# Patient Record
Sex: Male | Born: 1975 | Hispanic: No | Marital: Married | Smoking: Never smoker
Health system: Southern US, Community
[De-identification: ages and names within clinical notes are randomized; demographics above are authoritative.]

## PROBLEM LIST (undated history)

## (undated) DIAGNOSIS — I10 Essential (primary) hypertension: Secondary | ICD-10-CM

## (undated) DIAGNOSIS — N201 Calculus of ureter: Secondary | ICD-10-CM

## (undated) DIAGNOSIS — M549 Dorsalgia, unspecified: Secondary | ICD-10-CM

## (undated) DIAGNOSIS — E119 Type 2 diabetes mellitus without complications: Secondary | ICD-10-CM

---

## 2011-11-27 ENCOUNTER — Other Ambulatory Visit: Payer: Self-pay | Admitting: Family Medicine

## 2011-11-27 ENCOUNTER — Telehealth: Payer: Self-pay

## 2011-11-27 MED ORDER — ATORVASTATIN CALCIUM 10 MG PO TABS: 10.0000 mg | ORAL_TABLET | Freq: Every day | ORAL | Status: DC

## 2011-11-27 MED ORDER — LOSARTAN POTASSIUM 25 MG PO TABS: 25.0000 mg | ORAL_TABLET | Freq: Every day | ORAL | Status: DC

## 2011-11-27 MED ORDER — METFORMIN HCL 500 MG PO TABS: 500.0000 mg | ORAL_TABLET | Freq: Two times a day (BID) | ORAL | Status: DC

## 2011-11-27 NOTE — Telephone Encounter (Signed)
Meds refilled x 1 month, needs office visit for more.

## 2011-11-27 NOTE — Telephone Encounter (Signed)
Crystal from  [facility] is requesting refill on pt medications metphormin ,autoristatin and losartin please Location manager at pharmacy [phone]

## 2012-01-26 ENCOUNTER — Other Ambulatory Visit: Payer: Self-pay | Admitting: Physician Assistant

## 2012-03-08 ENCOUNTER — Other Ambulatory Visit: Payer: Self-pay | Admitting: Physician Assistant

## 2015-08-09 ENCOUNTER — Encounter (HOSPITAL_BASED_OUTPATIENT_CLINIC_OR_DEPARTMENT_OTHER): Payer: Self-pay | Admitting: Emergency Medicine

## 2015-08-09 ENCOUNTER — Emergency Department (HOSPITAL_BASED_OUTPATIENT_CLINIC_OR_DEPARTMENT_OTHER)
Admission: EM | Admit: 2015-08-09 | Discharge: 2015-08-09 | Disposition: A | Discharge status: Home / Self Care | Payer: BLUE CROSS/BLUE SHIELD | Attending: Emergency Medicine | Admitting: Emergency Medicine

## 2015-08-09 ENCOUNTER — Emergency Department (HOSPITAL_BASED_OUTPATIENT_CLINIC_OR_DEPARTMENT_OTHER): Payer: BLUE CROSS/BLUE SHIELD

## 2015-08-09 DIAGNOSIS — Z7984 Long term (current) use of oral hypoglycemic drugs: Secondary | ICD-10-CM | POA: Diagnosis not present

## 2015-08-09 DIAGNOSIS — M545 Low back pain: Secondary | ICD-10-CM | POA: Insufficient documentation

## 2015-08-09 DIAGNOSIS — R200 Anesthesia of skin: Secondary | ICD-10-CM | POA: Diagnosis not present

## 2015-08-09 DIAGNOSIS — Z79899 Other long term (current) drug therapy: Secondary | ICD-10-CM | POA: Insufficient documentation

## 2015-08-09 DIAGNOSIS — I10 Essential (primary) hypertension: Secondary | ICD-10-CM | POA: Insufficient documentation

## 2015-08-09 DIAGNOSIS — R202 Paresthesia of skin: Secondary | ICD-10-CM | POA: Insufficient documentation

## 2015-08-09 HISTORY — DX: Dorsalgia, unspecified: M54.9

## 2015-08-09 HISTORY — DX: Essential (primary) hypertension: I10

## 2015-08-09 LAB — CBG MONITORING, ED: Glucose-Capillary: 236 mg/dL — ABNORMAL HIGH (ref 65–99)

## 2015-08-09 MED ORDER — NAPROXEN 500 MG PO TABS: 500.0000 mg | ORAL_TABLET | Freq: Two times a day (BID) | ORAL | Status: DC

## 2015-08-09 MED ORDER — KETOROLAC TROMETHAMINE 60 MG/2ML IM SOLN
60.0000 mg | Freq: Once | INTRAMUSCULAR | Status: AC
Start: 2015-08-09 — End: 2015-08-09
  Administered 2015-08-09: 60 mg via INTRAMUSCULAR
  Filled 2015-08-09: qty 2

## 2015-08-09 MED ORDER — DEXAMETHASONE SODIUM PHOSPHATE 10 MG/ML IJ SOLN
10.0000 mg | Freq: Once | INTRAMUSCULAR | Status: AC
  Administered 2015-08-09: 10 mg via INTRAMUSCULAR
  Filled 2015-08-09: qty 1

## 2015-08-09 MED ORDER — DEXAMETHASONE SODIUM PHOSPHATE 10 MG/ML IJ SOLN: 10.0000 mg | Freq: Once | INTRAMUSCULAR | Status: DC

## 2015-08-09 MED FILL — NAPROXEN 500 MG TABLET: 500 | 15 days supply | Qty: 30 | Fill #0

## 2015-08-09 NOTE — ED Notes (Signed)
PA at bedside.

## 2015-08-09 NOTE — ED Notes (Addendum)
Patient reports that he has had back pain x 1 week. Reports that his right lower leg is numb intermittently. Patient stopped taking his diabetes medication and BP pills because they did not make him feel good. The patient has not checked his sugar since.

## 2015-08-09 NOTE — Discharge Instructions (Signed)

## 2015-08-09 NOTE — ED Provider Notes (Signed)
CSN: [id number]     Arrival date & time 08/09/15  1334 History   First MD Initiated Contact with Patient 08/09/15 1605     Chief Complaint  Patient presents with  . Back Pain     (Consider location/radiation/quality/duration/timing/severity/associated sxs/prior Treatment) Patient is a 40 y.o. male presenting with back pain. The history is provided by the patient.  Back Pain Location:  Lumbar spine Quality:  Burning, stabbing and shooting Radiates to:  R posterior upper leg, R knee and R foot Pain severity:  Moderate Pain is:  Worse during the day Onset quality:  Gradual Duration:  2 weeks Timing:  Intermittent Progression:  Unchanged Chronicity:  New Context: lifting heavy objects   Context: not falling, not jumping from heights and not recent injury   Relieved by:  Bed rest, lying down, OTC medications and NSAIDs Worsened by:  Ambulation and standing Ineffective treatments:  None tried Associated symptoms: leg pain, numbness (intermittent) and tingling   Associated symptoms: no abdominal pain, no abdominal swelling, no bladder incontinence, no bowel incontinence, no chest pain, no dysuria, no fever, no paresthesias, no pelvic pain, no perianal numbness, no weakness and no weight loss   Risk factors: no hx of cancer and not obese    Jimmy Phillips is a 40 y.o. male with PMH significant for HTN who presents with 1.5 week history of intermittent, moderate, "pulling" stabbing low back pain that radiates down posterior right thigh, calf, and foot.  Denies any injury/trauma.  He reports taking 400 mg ibuprofen with mld relief.  Modifying factors include sitting down and lying down.  Aggravating factors include standing on his feet for long periods of time.  He reports he has been doing heavy lifting at work, and he works at [employer].  Denies bowel/bladder incontinence, fever, abdominal pain, or urinary symptoms.  No hx of cancer or IVDU.  He reports his family members have back issues as  well and have required surgery.  He reports he has an appointment with a spine specialist in 2 weeks.  Past Medical History  Diagnosis Date  . Back pain   . Hypertension    History reviewed. No pertinent past surgical history. History reviewed. No pertinent family history. Social History  Substance Use Topics  . Smoking status: Never Smoker   . Smokeless tobacco: None  . Alcohol Use: Yes    Review of Systems  Constitutional: Negative for fever, chills and weight loss.  Respiratory: Negative for shortness of breath.   Cardiovascular: Negative for chest pain.  Gastrointestinal: Negative for nausea, vomiting, abdominal pain and bowel incontinence.  Genitourinary: Negative for bladder incontinence, dysuria, hematuria and pelvic pain.  Musculoskeletal: Positive for back pain. Negative for gait problem.  Neurological: Positive for tingling and numbness (intermittent). Negative for weakness and paresthesias.  All other systems reviewed and are negative.     Allergies  Review of patient's allergies indicates no known allergies.  Home Medications   Prior to Admission medications   Medication Sig Start Date End Date Taking? Authorizing Provider  atorvastatin (LIPITOR) 10 MG tablet TAKE 1 TABLET (10 MG TOTAL) BY MOUTH DAILY. NEEDS OFFICE VISIT/LABS 01/26/12   Anders Simmonds, PA-C  losartan (COZAAR) 25 MG tablet TAKE 1 TABLET (25 MG TOTAL) BY MOUTH DAILY. NEEDS OFFICE VISIT 01/26/12   Anders Simmonds, PA-C  metFORMIN (GLUCOPHAGE) 500 MG tablet TAKE 1 TABLET BY MOUTH TWICE A DAY WITH A MEAL **NEEDS OFFICE VISIT/LABS** 01/26/12   Anders Simmonds, PA-C  naproxen (  NAPROSYN) 500 MG tablet Take 1 tablet (500 mg total) by mouth 2 (two) times daily. 08/09/15   Caisley Baxendale, PA-C   BP 117/77 mmHg  Pulse 70  Temp(Src) 98 F (36.7 C) (Oral)  Resp 16  Ht  (1.778 m)  Wt 95.255 kg  BMI 30.13 kg/m2  SpO2 98% Physical Exam  Constitutional: He is oriented to person, place, and time. He  appears well-developed and well-nourished.  HENT:  Head: Atraumatic.  Eyes: Conjunctivae are normal.  Cardiovascular: Normal rate, regular rhythm, normal heart sounds and intact distal pulses.   Pulses:      Dorsalis pedis pulses are 2+ on the right side, and 2+ on the left side.  Pulmonary/Chest: Effort normal and breath sounds normal.  Abdominal: Soft. Bowel sounds are normal. He exhibits no distension. There is no tenderness.  Musculoskeletal: Normal range of motion. He exhibits no tenderness.       Lumbar back: He exhibits normal range of motion, no tenderness, no bony tenderness, no swelling, no deformity, no pain and no spasm.  No spinous process tenderness.  No step offs. No crepitus.  Neurological: He is alert and oriented to person, place, and time.  No saddle anesthesia. Strength and sensation intact bilaterally throughout lower extremities.  Gait normal.  Skin: Skin is warm and dry.  Psychiatric: He has a normal mood and affect. His behavior is normal.    ED Course  Procedures (including critical care time) Labs Review Labs Reviewed  CBG MONITORING, ED - Abnormal; Notable for the following:    Glucose-Capillary 236 (*)    All other components within normal limits    Imaging Review Dg Lumbar Spine Complete  08/09/2015  CLINICAL DATA:  Lumbar spine pain for 1.5 weeks, no known injuries EXAM: LUMBAR SPINE - COMPLETE 4+ VIEW COMPARISON:  None FINDINGS: 6 non-rib-bearing lumbar type segments. Vertebral body heights maintained. Mild disc space narrowing lower lumbar spine. No acute fracture, subluxation or bone destruction. No spondylolysis. SI joints symmetric. 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 No acute abnormalities. Electronically Signed   By: Ulyses Southward M.D.   On: 08/09/2015 16:43   I have personally reviewed and evaluated these images and lab results as part of my medical decision-making.   EKG Interpretation None      MDM   Final  diagnoses:  Bilateral low back pain, with sciatica presence unspecified   Patient presents with low back pain.  VSS.  No injury/trauma.  No red flags.  No neurological deficits.  Distal pulses intact.  No gait abnormalities.  Suspect low back strain or other mechanical cause.  Doubt cauda equina.  Doubt infectious process.  Doubt AAA.  Plain films show mild degenerative disc disease changes.  POCT CBG 236.  Discussed importance of PCP follow up regarding blood sugar management.  Discussed return precautions to the ED.  Follow up with PCP and spine specialist appointment in 2 weeks.       Cheri Fowler, PA-C 08/09/15 1736  Alvira Monday, MD 08/11/15 [phone]

## 2017-01-20 ENCOUNTER — Emergency Department (HOSPITAL_COMMUNITY)
Admission: EM | Admit: 2017-01-20 | Discharge: 2017-01-21 | Disposition: A | Discharge status: Home / Self Care | Payer: Self-pay | Attending: Emergency Medicine | Admitting: Emergency Medicine

## 2017-01-20 ENCOUNTER — Encounter (HOSPITAL_COMMUNITY): Payer: Self-pay | Admitting: Emergency Medicine

## 2017-01-20 DIAGNOSIS — Z7984 Long term (current) use of oral hypoglycemic drugs: Secondary | ICD-10-CM | POA: Insufficient documentation

## 2017-01-20 DIAGNOSIS — N134 Hydroureter: Secondary | ICD-10-CM | POA: Insufficient documentation

## 2017-01-20 DIAGNOSIS — N132 Hydronephrosis with renal and ureteral calculous obstruction: Secondary | ICD-10-CM | POA: Insufficient documentation

## 2017-01-20 DIAGNOSIS — E119 Type 2 diabetes mellitus without complications: Secondary | ICD-10-CM | POA: Insufficient documentation

## 2017-01-20 DIAGNOSIS — N201 Calculus of ureter: Secondary | ICD-10-CM

## 2017-01-20 HISTORY — DX: Calculus of ureter: N20.1

## 2017-01-20 HISTORY — DX: Type 2 diabetes mellitus without complications: E11.9

## 2017-01-20 LAB — CBC
HCT: 44.8 % (ref 39.0–52.0)
Hemoglobin: 15.5 g/dL (ref 13.0–17.0)
MCH: 29.4 pg (ref 26.0–34.0)
MCHC: 34.6 g/dL (ref 30.0–36.0)
MCV: 84.8 fL (ref 78.0–100.0)
PLATELETS: 334 10*3/uL (ref 150–400)
RBC: 5.28 MIL/uL (ref 4.22–5.81)
RDW: 12.5 % (ref 11.5–15.5)
WBC: 22.9 10*3/uL — ABNORMAL HIGH (ref 4.0–10.5)

## 2017-01-20 NOTE — ED Triage Notes (Addendum)
Pt from home with right abdominal and flank pain that began 2 hours PTA. Pt states when pain has intensified he has experienced emesis x 2. Pt is not febrile nor tachycardic at time of assessment. Pt has hx of kidney stones

## 2017-01-21 ENCOUNTER — Emergency Department (HOSPITAL_COMMUNITY): Payer: Self-pay

## 2017-01-21 ENCOUNTER — Encounter (HOSPITAL_COMMUNITY): Payer: Self-pay | Admitting: Emergency Medicine

## 2017-01-21 LAB — URINALYSIS, ROUTINE W REFLEX MICROSCOPIC
Bacteria, UA: NONE SEEN
Bilirubin Urine: NEGATIVE
KETONES UR: 20 mg/dL — AB
Leukocytes, UA: NEGATIVE
Nitrite: NEGATIVE
PH: 6 (ref 5.0–8.0)
PROTEIN: NEGATIVE mg/dL
Specific Gravity, Urine: 1.02 (ref 1.005–1.030)
Squamous Epithelial / LPF: NONE SEEN

## 2017-01-21 LAB — COMPREHENSIVE METABOLIC PANEL
ALT: 75 U/L — ABNORMAL HIGH (ref 17–63)
ANION GAP: 11 (ref 5–15)
AST: 36 U/L (ref 15–41)
Albumin: 4.7 g/dL (ref 3.5–5.0)
Alkaline Phosphatase: 62 U/L (ref 38–126)
BILIRUBIN TOTAL: 0.6 mg/dL (ref 0.3–1.2)
BUN: 23 mg/dL — ABNORMAL HIGH (ref 6–20)
CHLORIDE: 101 mmol/L (ref 101–111)
CO2: 24 mmol/L (ref 22–32)
Calcium: 9.6 mg/dL (ref 8.9–10.3)
Creatinine, Ser: 1.25 mg/dL — ABNORMAL HIGH (ref 0.61–1.24)
Glucose, Bld: 263 mg/dL — ABNORMAL HIGH (ref 65–99)
POTASSIUM: 3.8 mmol/L (ref 3.5–5.1)
Sodium: 136 mmol/L (ref 135–145)
TOTAL PROTEIN: 8.3 g/dL — AB (ref 6.5–8.1)

## 2017-01-21 LAB — DIFFERENTIAL
BASOS PCT: 0 %
Basophils Absolute: 0.1 10*3/uL (ref 0.0–0.1)
EOS PCT: 0 %
Eosinophils Absolute: 0.1 10*3/uL (ref 0.0–0.7)
LYMPHS PCT: 10 %
Lymphs Abs: 2.3 10*3/uL (ref 0.7–4.0)
MONO ABS: 0.7 10*3/uL (ref 0.1–1.0)
MONOS PCT: 3 %
NEUTROS ABS: 20.2 10*3/uL — AB (ref 1.7–7.7)
Neutrophils Relative %: 87 %

## 2017-01-21 LAB — LIPASE, BLOOD: LIPASE: 30 U/L (ref 11–51)

## 2017-01-21 MED ORDER — ONDANSETRON 8 MG PO TBDP: 8.0000 mg | ORAL_TABLET | Freq: Three times a day (TID) | ORAL | 0 refills | Status: AC | PRN

## 2017-01-21 MED ORDER — HYDROMORPHONE HCL 1 MG/ML IJ SOLN
1.0000 mg | Freq: Once | INTRAMUSCULAR | Status: AC
  Administered 2017-01-21: 1 mg via INTRAVENOUS
  Filled 2017-01-21: qty 1

## 2017-01-21 MED ORDER — HYDROMORPHONE HCL 2 MG PO TABS: 2.0000 mg | ORAL_TABLET | ORAL | 0 refills | Status: AC | PRN

## 2017-01-21 MED ORDER — ONDANSETRON HCL 4 MG/2ML IJ SOLN
4.0000 mg | Freq: Once | INTRAMUSCULAR | Status: AC
  Administered 2017-01-21: 4 mg via INTRAVENOUS
  Filled 2017-01-21: qty 2

## 2017-01-21 NOTE — ED Provider Notes (Addendum)
[facility]-EMERGENCY DEPT Provider Note: Jimmy DellJ. Lane [name], MD, FACEP  CSN: [id number] MRN: [id number] ARRIVAL: 01/20/17 at 2133 ROOM: [facility]04/[facility]04   CHIEF COMPLAINT  Flank Pain   HISTORY OF PRESENT ILLNESS  Jimmy Phillips is a 41 y.o. male with right flank pain radiating to his right lower quadrant. The onset was about 8:30 PM yesterday evening. He is not sure if the pain is like that of previous kidney stone. He rates his pain as a 10 out of 10. There has been associated nausea and vomiting. He has not noticed any hematuria. Pain is somewhat worse with movement but he cannot find a comfortable position.   Past Medical History:  Diagnosis Date  . Back pain   . Diabetes mellitus without complication (HCC)   . Hypertension   . Ureterolithiasis     No past surgical history on file.  No family history on file.  Social History  Substance Use Topics  . Smoking status: Never Smoker  . Smokeless tobacco: Not on file  . Alcohol use Yes    Prior to Admission medications   Medication Sig Start Date End Date Taking? Authorizing Provider  losartan (COZAAR) 25 MG tablet TAKE 1 TABLET (25 MG TOTAL) BY MOUTH DAILY. NEEDS OFFICE VISIT 01/26/12  Yes Anders SimmondsMcClung, Angela M, PA-C  metFORMIN (GLUCOPHAGE-XR) 500 MG 24 hr tablet Take 1,000 mg by mouth daily. 01/03/17  Yes [provider]  atorvastatin (LIPITOR) 10 MG tablet TAKE 1 TABLET (10 MG TOTAL) BY MOUTH DAILY. NEEDS OFFICE VISIT/LABS Patient not taking: Reported on 01/21/2017 01/26/12   Anders SimmondsMcClung, Angela M, PA-C  metFORMIN (GLUCOPHAGE) 500 MG tablet TAKE 1 TABLET BY MOUTH TWICE A DAY WITH A MEAL **NEEDS OFFICE VISIT/LABS** Patient not taking: Reported on 01/21/2017 01/26/12   Anders SimmondsMcClung, Angela M, PA-C    Allergies Patient has no known allergies.   REVIEW OF SYSTEMS  Negative except as noted here or in the History of Present Illness.   PHYSICAL EXAMINATION  Initial Vital Signs Blood pressure (!) 142/86, pulse 74, temperature 97.7 F (36.5 C),  temperature source Oral, resp. rate 20, height 5\' 11"  (1.803 m), weight 95.3 kg (210 lb), SpO2 99 %.  Examination General: Well-developed, well-nourished male in no acute distress; appearance consistent with age of record HENT: normocephalic; atraumatic Eyes: pupils equal, round and reactive to light; extraocular muscles intact Neck: supple Heart: regular rate and rhythm Lungs: clear to auscultation bilaterally Abdomen: soft; nondistended; right lower quadrant tenderness; bowel sounds present GU: Right CVA tenderness Extremities: No deformity; full range of motion; pulses normal Neurologic: Awake, alert and oriented; motor function intact in all extremities and symmetric; no facial droop Skin: Warm and dry Psychiatric: Flat affect   RESULTS  Summary of this visit's results, reviewed by myself:   EKG Interpretation  Date/Time:    Ventricular Rate:    PR Interval:    QRS Duration:   QT Interval:    QTC Calculation:   R Axis:     Text Interpretation:        Laboratory Studies: Results for orders placed or performed during the hospital encounter of 01/20/17 (from the past 24 hour(s))  Lipase, blood     Status: None   Collection Time: 01/20/17 11:41 PM  Result Value Ref Range   Lipase 30 11 - 51 U/L  Comprehensive metabolic panel     Status: Abnormal   Collection Time: 01/20/17 11:41 PM  Result Value Ref Range   Sodium 136 135 - 145 mmol/L   Potassium  3.8 3.5 - 5.1 mmol/L   Chloride 101 101 - 111 mmol/L   CO2 24 22 - 32 mmol/L   Glucose, Bld 263 (H) 65 - 99 mg/dL   BUN 23 (H) 6 - 20 mg/dL   Creatinine, Ser 1.61 (H) 0.61 - 1.24 mg/dL   Calcium 9.6 8.9 - 09.6 mg/dL   Total Protein 8.3 (H) 6.5 - 8.1 g/dL   Albumin 4.7 3.5 - 5.0 g/dL   AST 36 15 - 41 U/L   ALT 75 (H) 17 - 63 U/L   Alkaline Phosphatase 62 38 - 126 U/L   Total Bilirubin 0.6 0.3 - 1.2 mg/dL   GFR calc non Af Amer >60 >60 mL/min   GFR calc Af Amer >60 >60 mL/min   Anion gap 11 5 - 15  CBC     Status:  Abnormal   Collection Time: 01/20/17 11:41 PM  Result Value Ref Range   WBC 22.9 (H) 4.0 - 10.5 K/uL   RBC 5.28 4.22 - 5.81 MIL/uL   Hemoglobin 15.5 13.0 - 17.0 g/dL   HCT 04.5 40.9 - 81.1 %   MCV 84.8 78.0 - 100.0 fL   MCH 29.4 26.0 - 34.0 pg   MCHC 34.6 30.0 - 36.0 g/dL   RDW 91.4 78.2 - 95.6 %   Platelets 334 150 - 400 K/uL  Differential     Status: Abnormal   Collection Time: 01/20/17 11:41 PM  Result Value Ref Range   Neutrophils Relative % 87 %   Neutro Abs 20.2 (H) 1.7 - 7.7 K/uL   Lymphocytes Relative 10 %   Lymphs Abs 2.3 0.7 - 4.0 K/uL   Monocytes Relative 3 %   Monocytes Absolute 0.7 0.1 - 1.0 K/uL   Eosinophils Relative 0 %   Eosinophils Absolute 0.1 0.0 - 0.7 K/uL   Basophils Relative 0 %   Basophils Absolute 0.1 0.0 - 0.1 K/uL  Urinalysis, Routine w reflex microscopic     Status: Abnormal   Collection Time: 01/21/17 12:24 AM  Result Value Ref Range   Color, Urine STRAW (A) YELLOW   APPearance CLEAR CLEAR   Specific Gravity, Urine 1.020 1.005 - 1.030   pH 6.0 5.0 - 8.0   Glucose, UA >=500 (A) NEGATIVE mg/dL   Hgb urine dipstick MODERATE (A) NEGATIVE   Bilirubin Urine NEGATIVE NEGATIVE   Ketones, ur 20 (A) NEGATIVE mg/dL   Protein, ur NEGATIVE NEGATIVE mg/dL   Nitrite NEGATIVE NEGATIVE   Leukocytes, UA NEGATIVE NEGATIVE   RBC / HPF TOO NUMEROUS TO COUNT 0 - 5 RBC/hpf   WBC, UA 0-5 0 - 5 WBC/hpf   Bacteria, UA NONE SEEN NONE SEEN   Squamous Epithelial / LPF NONE SEEN NONE SEEN   Mucous PRESENT    Imaging Studies: Ct Renal Stone Study  Result Date: 01/21/2017 CLINICAL DATA:  Right flank pain with nausea and vomiting EXAM: CT ABDOMEN AND PELVIS WITHOUT CONTRAST TECHNIQUE: Multidetector CT imaging of the abdomen and pelvis was performed following the standard protocol without IV contrast. COMPARISON:  None. FINDINGS: Lower chest: Lung bases demonstrate patchy dependent atelectasis. Heart size upper normal. Hepatobiliary: No focal liver abnormality is seen. No  gallstones, gallbladder wall thickening, or biliary dilatation. Pancreas: Unremarkable. No pancreatic ductal dilatation or surrounding inflammatory changes. Spleen: Normal in size without focal abnormality. Adrenals/Urinary Tract: Adrenal glands are within normal limits. Punctate nonobstructing stones in the left kidney. Mild right perinephric fat stranding. Mild right hydronephrosis and hydroureter, secondary to a 3 mm  stone at the right UVJ. Bladder otherwise normal Stomach/Bowel: Stomach is within normal limits. Appendix appears normal. No evidence of bowel wall thickening, distention, or inflammatory changes. Vascular/Lymphatic: Aortic atherosclerosis. No enlarged abdominal or pelvic lymph nodes. Reproductive: Status post hysterectomy. No adnexal masses. Other: No free air or free fluid Musculoskeletal: Degenerative changes. No acute or suspicious bone lesion IMPRESSION: 1. Mild right hydronephrosis and hydroureter, secondary to a 3 mm stone at the right UVJ 2. Negative appendix 3. Punctate nonobstructing stones in the left kidney Electronically Signed   By: Jasmine Pang M.D.   On: 01/21/2017 01:51    ED COURSE  Nursing notes and initial vitals signs, including pulse oximetry, reviewed.  Vitals:   01/20/17 2352 01/21/17 0030 01/21/17 0100 01/21/17 0200  BP: (!) 142/86 130/70 140/89 132/88  Pulse: 74 63 72 69  Resp: 20  20   Temp: 97.7 F (36.5 C)     TempSrc: Oral     SpO2: 99% 99% 96% 96%  Weight:      Height:       2:00 AM Pain is down to 2 out of 10. Patient advised of CT findings. Patient does not have a urologist, we will provide referral.Advised of risk of constipation with narcotics and recommended he consider a laxative while taking pain medication.  PROCEDURES    ED DIAGNOSES     ICD-10-CM   1. Ureterolithiasis N20.1        Belen Pesch, MD 01/21/17 1610    Paula Libra, MD 01/21/17 [id number]

## 2017-01-21 NOTE — ED Notes (Signed)
Pt understands to strain all urine with strainer. Verbalizes understanding and importance of following up with urology.

## 2017-01-21 NOTE — ED Notes (Signed)
Patient transported to CT 

## 2017-11-29 IMAGING — CT CT RENAL STONE PROTOCOL
2 of 3 series · 16 of 46 positions shown, 18 images · non-contrast
Comparison: None.

CLINICAL DATA: Right flank pain with nausea and vomiting

EXAM:
CT ABDOMEN AND PELVIS WITHOUT CONTRAST
TECHNIQUE: Multidetector CT imaging of the abdomen and pelvis was performed
following the standard protocol without IV contrast.

[Series 3: lung · axial · 0.74mm/px · z∈[+1430,+1518]mm · 13 of 52 slices shown, 15 images]
[im 4/52  soft-tissue]
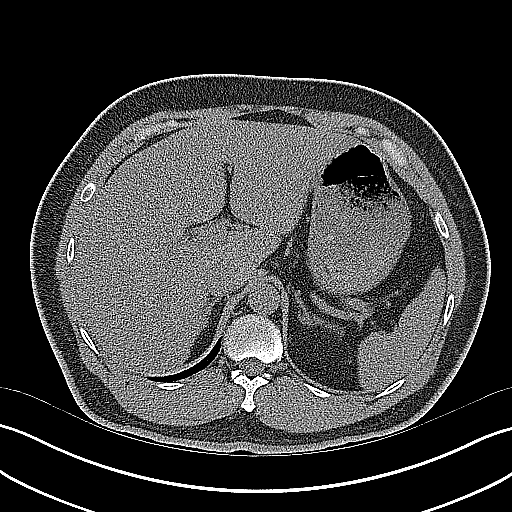
[im 4/52  bone]
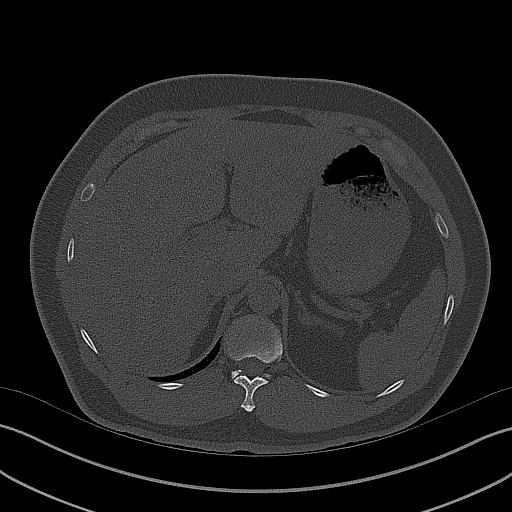
[im 7/52  soft-tissue]
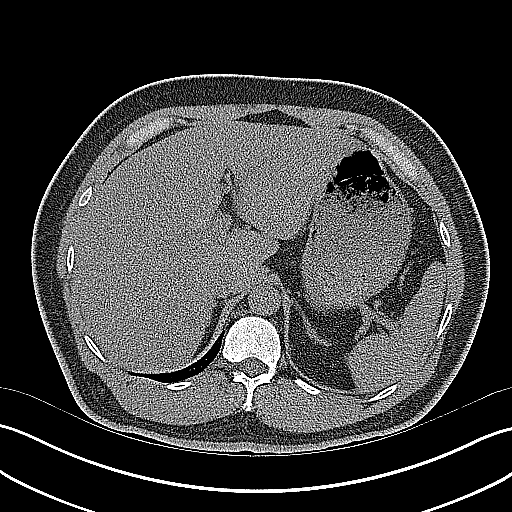
[im 10/52  soft-tissue]
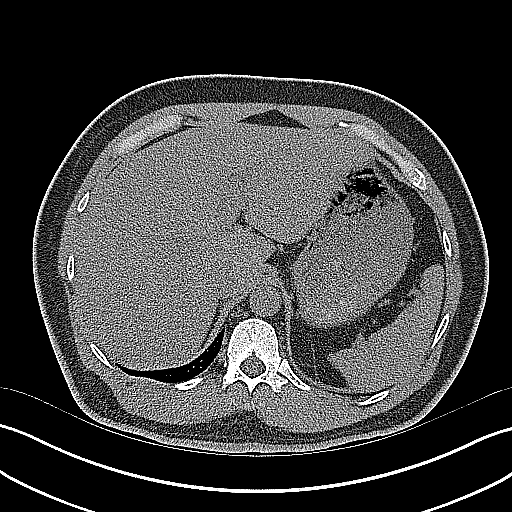
[im 15/52  soft-tissue]
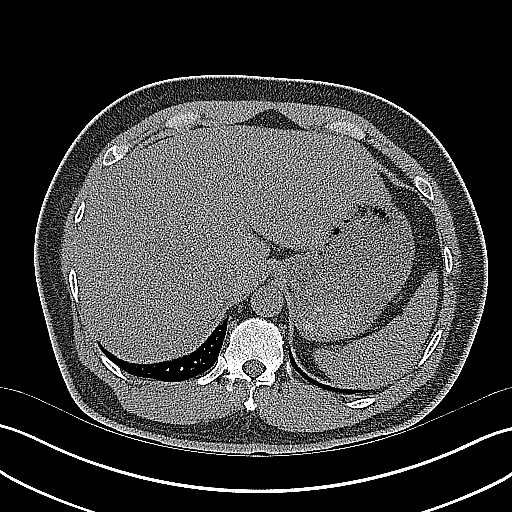
[im 19/52  soft-tissue]
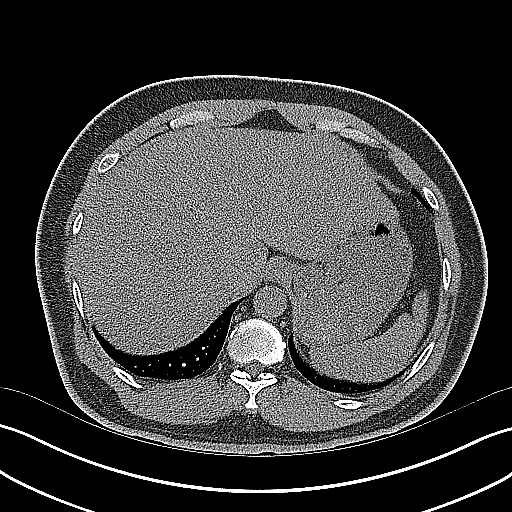
[im 22/52  soft-tissue]
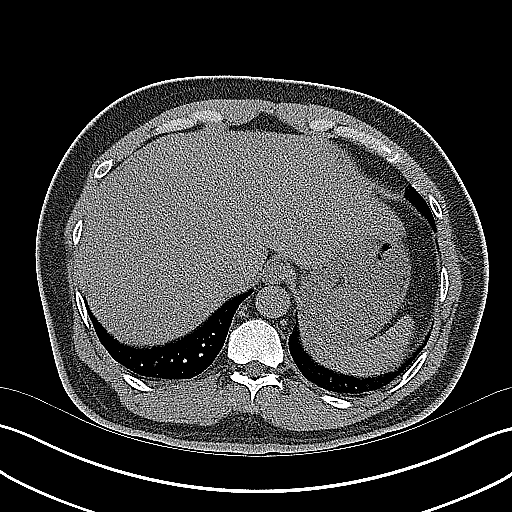
[im 27/52  soft-tissue]
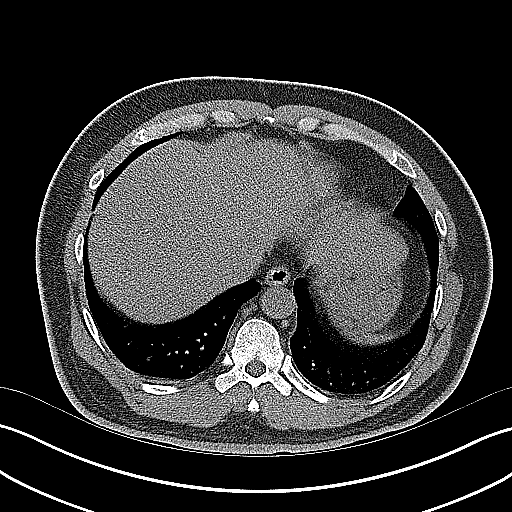
[im 30/52  soft-tissue]
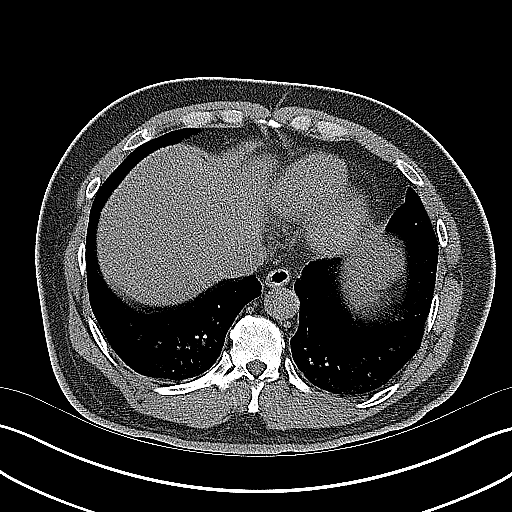
[im 33/52  soft-tissue]
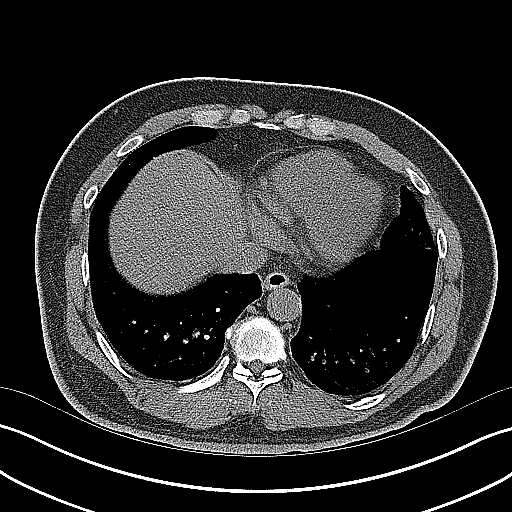
[im 33/52  bone]
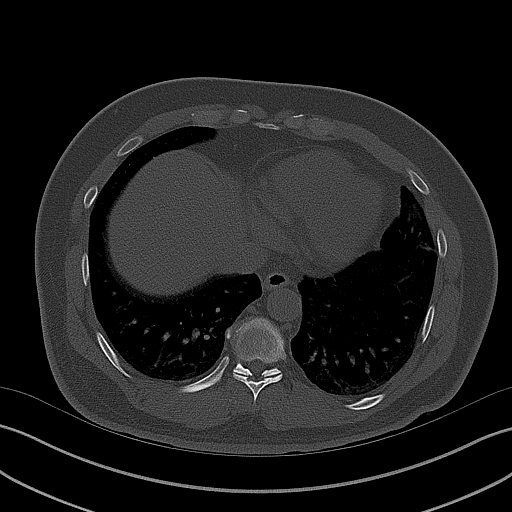
[im 37/52  soft-tissue]
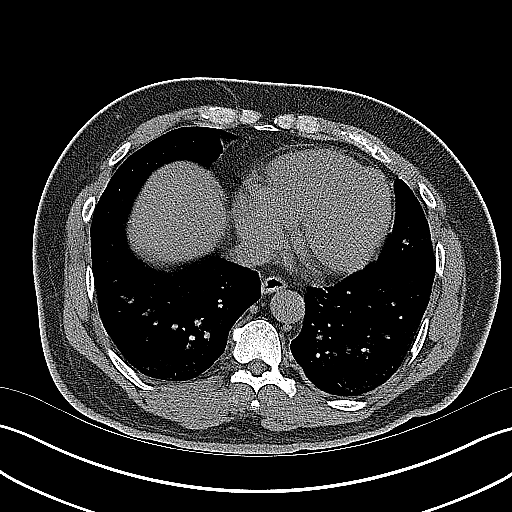
[im 42/52  soft-tissue]
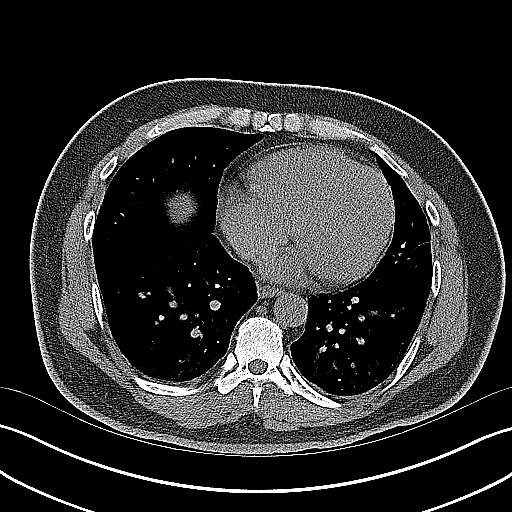
[im 45/52  soft-tissue]
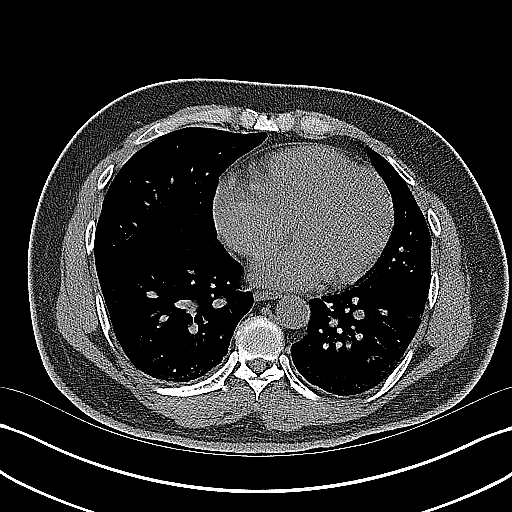
[im 48/52  soft-tissue]
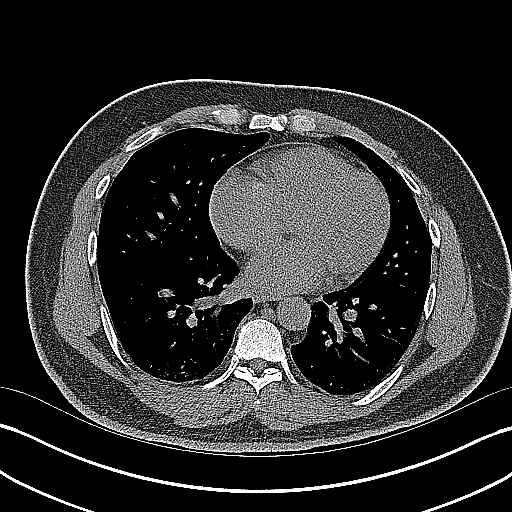

[Series 4: coronal · coronal · 0.70mm/px · 3 of 122 slices shown]
[im 41/122  soft-tissue]
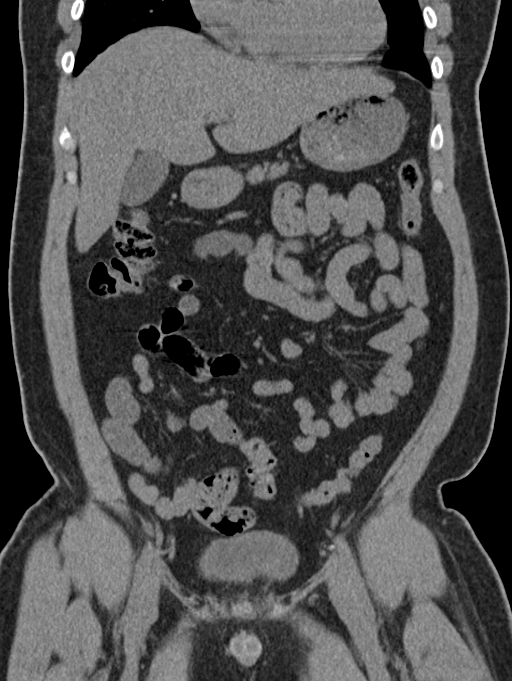
[im 54/122  soft-tissue]
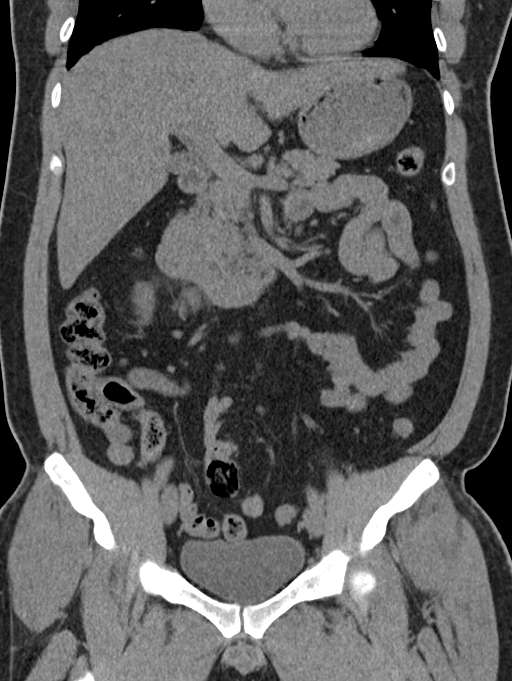
[im 68/122  soft-tissue]
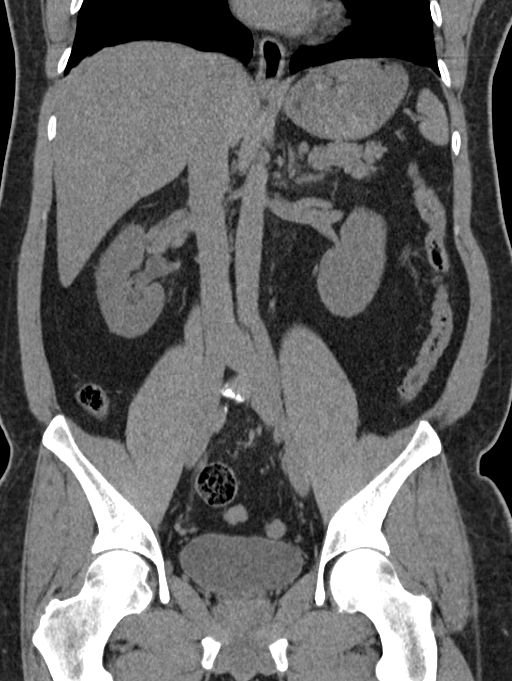

[16 of 46 positions shown; findings below may reference images not displayed]

FINDINGS: Lower chest: Lung bases demonstrate patchy dependent atelectasis.
Heart size upper normal.

Hepatobiliary: No focal liver abnormality is seen. No gallstones,
gallbladder wall thickening, or biliary dilatation.

Pancreas: Unremarkable. No pancreatic ductal dilatation or
surrounding inflammatory changes.

Spleen: Normal in size without focal abnormality.

Adrenals/Urinary Tract: Adrenal glands are within normal limits.
Punctate nonobstructing stones in the left kidney. Mild right
perinephric fat stranding. Mild right hydronephrosis and
hydroureter, secondary to a 3 mm stone at the right UVJ. Bladder
otherwise normal

Stomach/Bowel: Stomach is within normal limits. Appendix appears
normal. No evidence of bowel wall thickening, distention, or
inflammatory changes.

Vascular/Lymphatic: Aortic atherosclerosis. No enlarged abdominal or
pelvic lymph nodes.

Reproductive: Status post hysterectomy. No adnexal masses.

Other: No free air or free fluid

Musculoskeletal: Degenerative changes. No acute or suspicious bone
lesion
IMPRESSION: 1. Mild right hydronephrosis and hydroureter, secondary to a 3 mm
stone at the right UVJ
2. Negative appendix
3. Punctate nonobstructing stones in the left kidney

## 2019-10-02 ENCOUNTER — Ambulatory Visit: Payer: BLUE CROSS/BLUE SHIELD
